# Patient Record
Sex: Female | Born: 2000
Health system: Southern US, Community
[De-identification: ages and names within clinical notes are randomized; demographics above are authoritative.]

---
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
## 2020-07-25 ENCOUNTER — Other Ambulatory Visit: Payer: Self-pay

## 2020-07-25 ENCOUNTER — Ambulatory Visit
Admission: EM | Admit: 2020-07-25 | Discharge: 2020-07-25 | Disposition: A | Discharge status: Home / Self Care | Payer: Managed Care, Other (non HMO) | Attending: Family Medicine | Admitting: Family Medicine

## 2020-07-25 ENCOUNTER — Encounter: Payer: Self-pay | Admitting: Emergency Medicine

## 2020-07-25 DIAGNOSIS — K529 Noninfective gastroenteritis and colitis, unspecified: Secondary | ICD-10-CM | POA: Diagnosis not present

## 2020-07-25 MED ORDER — ONDANSETRON 4 MG PO TBDP: 4.0000 mg | ORAL_TABLET | Freq: Three times a day (TID) | ORAL | 0 refills | Status: DC | PRN

## 2020-07-25 MED ORDER — DICYCLOMINE HCL 20 MG PO TABS: 20.0000 mg | ORAL_TABLET | Freq: Four times a day (QID) | ORAL | 0 refills | Status: DC | PRN

## 2020-07-25 NOTE — ED Triage Notes (Signed)
Patient c/o vomiting, stomach pain and back pain that started on Tuesday.  Patient states that her family has had similar symptoms this week.  Patient also reports diarrhea.

## 2020-07-25 NOTE — Discharge Instructions (Signed)
Lots of fluids.  Medication as prescribed.  Take care  Dr. Mireya Meditz  

## 2020-07-25 NOTE — ED Provider Notes (Signed)
[facility]    CSN: [id number] Arrival date & time: 07/25/20  1434      History   Chief Complaint Chief Complaint  Patient presents with  . Abdominal Pain  . Emesis  . Back Pain   HPI  20 year old female presents with the above complaints.  Started on Tuesday. Family has been sick with GI illness. Patient states that she got it from her mother. She reports nausea, vomiting, diarrhea. Associated upper abdominal pain. Last episode of emesis was this morning. She is tolerating p.o. intake. She ate some bread recently. Pain 5/10 in severity. She has taken some over-the-counter medication without relief. No fever.   Home Medications    Prior to Admission medications   Medication Sig Start Date End Date Taking? Authorizing Provider  dicyclomine (BENTYL) 20 MG tablet Take 1 tablet (20 mg total) by mouth 4 (four) times daily as needed for spasms. 07/25/20  Yes Sylar Voong G, DO  ondansetron (ZOFRAN ODT) 4 MG disintegrating tablet Take 1 tablet (4 mg total) by mouth every 8 (eight) hours as needed for nausea or vomiting. 07/25/20  Yes Tommie Sams, DO   Social History Social History   Tobacco Use  . Smoking status: Never Smoker  . Smokeless tobacco: Never Used  Vaping Use  . Vaping Use: Never used  Substance Use Topics  . Alcohol use: No  . Drug use: No     Allergies   Patient has no known allergies.   Review of Systems Review of Systems  Constitutional: Positive for appetite change. Negative for fever.  Gastrointestinal: Positive for abdominal pain, diarrhea, nausea and vomiting.   Physical Exam Triage Vital Signs ED Triage Vitals  Enc Vitals Group     BP 07/25/20 1453 129/72     Pulse Rate 07/25/20 1453 83     Resp 07/25/20 1453 14     Temp 07/25/20 1453 98.4 F (36.9 C)     Temp Source 07/25/20 1453 Oral     SpO2 07/25/20 1453 100 %     Weight 07/25/20 1450 170 lb (77.1 kg)     Height 07/25/20 1450 5\' 7"  (1.702 m)     Head Circumference --       Peak Flow --      Pain Score 07/25/20 1450 5     Pain Loc --      Pain Edu? --      Excl. in GC? --    Updated Vital Signs BP 129/72 (BP Location: Left Arm)   Pulse 83   Temp 98.4 F (36.9 C) (Oral)   Resp 14   Ht 5\' 7"  (1.702 m)   Wt 77.1 kg   LMP 07/04/2020 (Approximate)   SpO2 100%   BMI 26.63 kg/m   Visual Acuity Right Eye Distance:   Left Eye Distance:   Bilateral Distance:    Right Eye Near:   Left Eye Near:    Bilateral Near:     Physical Exam Vitals and nursing note reviewed.  Constitutional:      General: She is not in acute distress.    Appearance: Normal appearance. She is well-developed. She is not ill-appearing.  HENT:     Head: Normocephalic and atraumatic.  Cardiovascular:     Rate and Rhythm: Normal rate and regular rhythm.  Pulmonary:     Effort: Pulmonary effort is normal.     Breath sounds: Normal breath sounds. No wheezing, rhonchi or rales.  Abdominal:  General: There is no distension.     Palpations: Abdomen is soft.     Comments: Tenderness to palpation in the epigastric region.  Neurological:     Mental Status: She is alert.    UC Treatments / Results  Labs (all labs ordered are listed, but only abnormal results are displayed) Labs Reviewed - No data to display  EKG   Radiology No results found.  Procedures Procedures (including critical care time)  Medications Ordered in UC Medications - No data to display  Initial Impression / Assessment and Plan / UC Course  I have reviewed the triage vital signs and the nursing notes.  Pertinent labs & imaging results that were available during my care of the patient were reviewed by me and considered in my medical decision making (see chart for details).    20 year old female presents with gastroenteritis. Treating with Zofran and dicyclomine. Supportive care. Work note given.  Final Clinical Impressions(s) / UC Diagnoses   Final diagnoses:  Gastroenteritis     Discharge  Instructions     Lots of fluids.  Medication as prescribed.  Take care  Dr. Adriana Simas    ED Prescriptions    Medication Sig Dispense Auth. Provider   ondansetron (ZOFRAN ODT) 4 MG disintegrating tablet Take 1 tablet (4 mg total) by mouth every 8 (eight) hours as needed for nausea or vomiting. 20 tablet Aldona Bryner G, DO   dicyclomine (BENTYL) 20 MG tablet Take 1 tablet (20 mg total) by mouth 4 (four) times daily as needed for spasms. 20 tablet Tommie Sams, DO     PDMP not reviewed this encounter.   Tommie Sams, [place] 07/25/20 1553

## 2021-03-05 ENCOUNTER — Encounter (HOSPITAL_BASED_OUTPATIENT_CLINIC_OR_DEPARTMENT_OTHER): Payer: Self-pay | Admitting: Family Medicine

## 2021-03-05 ENCOUNTER — Encounter (HOSPITAL_BASED_OUTPATIENT_CLINIC_OR_DEPARTMENT_OTHER): Payer: Self-pay | Admitting: Pediatrics

## 2021-03-05 ENCOUNTER — Other Ambulatory Visit: Payer: Self-pay

## 2021-03-05 ENCOUNTER — Ambulatory Visit (INDEPENDENT_AMBULATORY_CARE_PROVIDER_SITE_OTHER): Payer: Managed Care, Other (non HMO) | Admitting: Family Medicine

## 2021-03-05 DIAGNOSIS — R21 Rash and other nonspecific skin eruption: Secondary | ICD-10-CM | POA: Diagnosis not present

## 2021-03-05 DIAGNOSIS — K625 Hemorrhage of anus and rectum: Secondary | ICD-10-CM

## 2021-03-05 NOTE — Progress Notes (Signed)
New Patient Office Visit  Subjective:  Patient ID: Candace Owens, female    DOB: 2001-03-08  Age: 20 y.o. MRN: [id number]  CC:  Chief Complaint  Patient presents with   Establish Care    Patient states she has a rash on the back of her thighs and ribs with no pain or itching.  She reports sometimes when she wipes she see rectal blood.  No pain, itching or burning.    HPI Candace Owens is a 20 year old female presenting to establish in clinic.  She has current concerns as outlined above.  Reports past medical history of depressive mood.  Rash: First noticed about 3 months ago, had areas of flat, dark pinkish lesions on her skin, notably over posterior thigh, trunk.  These lasted for about 2 months and then resolved.  Denies any current lesions.  Never had any associated itch or pain.  No discharge or bleeding.  Denies any prior similar skin changes.  Rectal bleeding: First started about 2 years ago.  Primarily would notice blood when wiping, small amount.  Generally this will last for only about 1 day.  Since it began, reports that it will recur about every 2 to 3 months.  Most recent episode was in August.  Denies any associated pain or itching.  No associated nausea, vomiting, diarrhea or constipation.  Denies any family history of GI issues.  Feels that her weight has fluctuated some over this time, no specific unintentional weight loss recalled.  Patient currently works 2 jobs, one is with [employer] the other was with [employer].  Outside of work, she enjoys hiking, skateboarding, camping, being outdoors. Patient is originally from [place] but she has been primarily living in [place] for about the past 10 to 11 years.  History reviewed. No pertinent past medical history.  History reviewed. No pertinent surgical history.  Family History  Problem Relation Age of Onset   Depression Paternal Grandfather     Social History   Socioeconomic History   Marital  status: Single    Spouse name: Not on file   Number of children: Not on file   Years of education: Not on file   Highest education level: Not on file  Occupational History   Not on file  Tobacco Use   Smoking status: Never   Smokeless tobacco: Never  Vaping Use   Vaping Use: Never used  Substance and Sexual Activity   Alcohol use: No   Drug use: No   Sexual activity: Not Currently  Other Topics Concern   Not on file  Social History Narrative   Not on file   Social Determinants of Health   Financial Resource Strain: Not on file  Food Insecurity: Not on file  Transportation Needs: Not on file  Physical Activity: Not on file  Stress: Not on file  Social Connections: Not on file  Intimate Partner Violence: Not on file    Objective:   Today's Vitals: BP 110/80   Pulse 72   Ht 5\' 7"  (1.702 m)   Wt 194 lb 9.6 oz (88.3 kg)   SpO2 100%   BMI 30.48 kg/m   Physical Exam  20 year old female in no acute distress Cardiovascular exam with regular rate and rhythm, no murmurs appreciated Lungs clear to auscultation bilaterally  Assessment & Plan:   Problem List Items Addressed This Visit       Digestive   Rectal bleeding    No active bleeding today or other symptoms such  as abdominal pain, diarrhea or constipation Discussed most likely etiology is related to intermittent, temporary rectal bleeding and general considerations in order to ensure regular bowel movements, avoidance of constipation, avoidance of straining Recommend checking CBC and CMP, patient declines at this time Recommend monitoring symptoms, if symptoms do recur, recommend returning to the office for further evaluation        Musculoskeletal and Integument   Rash    Uncertain etiology given lack of rash present during office visit today Recommend continue to monitor for recurrence, if rash does recur, recommend presenting to the office to allow for further evaluation Could consider initial laboratory  evaluation including CBC and CMP, patient declines today Plan to complete labs with CPE in about 4 to 6 months       Outpatient Encounter Medications as of 03/05/2021  Medication Sig   [DISCONTINUED] dicyclomine (BENTYL) 20 MG tablet Take 1 tablet (20 mg total) by mouth 4 (four) times daily as needed for spasms.   [DISCONTINUED] ondansetron (ZOFRAN ODT) 4 MG disintegrating tablet Take 1 tablet (4 mg total) by mouth every 8 (eight) hours as needed for nausea or vomiting.   No facility-administered encounter medications on file as of 03/05/2021.    Follow-up: Return in about 6 months (around 09/03/2021) for CPE.  Plan to complete labs at next visit including CBC, CMP, TSH with reflex, A1c, lipid panel.  Christophere Hillhouse J [place], MD

## 2021-03-06 DIAGNOSIS — R21 Rash and other nonspecific skin eruption: Secondary | ICD-10-CM | POA: Insufficient documentation

## 2021-03-06 DIAGNOSIS — K625 Hemorrhage of anus and rectum: Secondary | ICD-10-CM | POA: Insufficient documentation

## 2021-03-06 NOTE — Assessment & Plan Note (Signed)
Uncertain etiology given lack of rash present during office visit today Recommend continue to monitor for recurrence, if rash does recur, recommend presenting to the office to allow for further evaluation Could consider initial laboratory evaluation including CBC and CMP, patient declines today Plan to complete labs with CPE in about 4 to 6 months

## 2021-03-06 NOTE — Assessment & Plan Note (Signed)
No active bleeding today or other symptoms such as abdominal pain, diarrhea or constipation Discussed most likely etiology is related to intermittent, temporary rectal bleeding and general considerations in order to ensure regular bowel movements, avoidance of constipation, avoidance of straining Recommend checking CBC and CMP, patient declines at this time Recommend monitoring symptoms, if symptoms do recur, recommend returning to the office for further evaluation

## 2021-09-03 ENCOUNTER — Encounter (HOSPITAL_BASED_OUTPATIENT_CLINIC_OR_DEPARTMENT_OTHER): Payer: Managed Care, Other (non HMO) | Admitting: Family Medicine

## 2022-07-14 ENCOUNTER — Encounter: Payer: Self-pay | Admitting: Family Medicine

## 2022-07-14 ENCOUNTER — Ambulatory Visit: Payer: BC Managed Care – PPO | Admitting: Family

## 2022-07-26 ENCOUNTER — Ambulatory Visit: Payer: BC Managed Care – PPO | Admitting: Family

## 2022-08-04 ENCOUNTER — Ambulatory Visit: Payer: BC Managed Care – PPO | Admitting: Family

## 2022-09-29 ENCOUNTER — Ambulatory Visit: Payer: BC Managed Care – PPO | Admitting: Family Medicine

## 2022-09-29 NOTE — Progress Notes (Deleted)
    New patient visit   Patient: Candace Owens   DOB: [birth year]   21 y.o. Female  MRN: [id number] Visit Date: 09/29/2022  Today's healthcare provider: Ronnald Ramp, MD   No chief complaint on file.  Subjective    Candace Owens is a 22 y.o. female who presents today as a new patient to establish care.  HPI   Encounter to Establish Care Patient presents to establish care  Introduced myself and my role as primary care physician  We reviewed patient's medical, surgical, and social history and medications as listed below    PMHX   Last annual physical: ***   *** Medications: ***   ***  Medications: ***    Social Hx  Tobacco use: *** Alcohol Use : *** Illicit drug use: ***  ***    Concerns for Today:   ***:   No past medical history on file. No past surgical history on file. Family Status  Relation Name Status   Mother  Alive   Father  Alive   PGF  (Not Specified)   Family History  Problem Relation Age of Onset   Depression Paternal Grandfather    Social History   Socioeconomic History   Marital status: Single    Spouse name: Not on file   Number of children: Not on file   Years of education: Not on file   Highest education level: Not on file  Occupational History   Not on file  Tobacco Use   Smoking status: Never   Smokeless tobacco: Never  Vaping Use   Vaping Use: Never used  Substance and Sexual Activity   Alcohol use: No   Drug use: No   Sexual activity: Not Currently  Other Topics Concern   Not on file  Social History Narrative   Not on file   Social Determinants of Health   Financial Resource Strain: Not on file  Food Insecurity: Not on file  Transportation Needs: Not on file  Physical Activity: Not on file  Stress: Not on file  Social Connections: Not on file   No outpatient medications prior to visit.   No facility-administered medications prior to visit.   No Known Allergies   There is no  immunization history on file for this patient.  Health Maintenance  Topic Date Due   COVID-19 Vaccine (1) Never done   HPV VACCINES (1 - 2-dose series) Never done   HIV Screening  Never done   Hepatitis C Screening  Never done   DTaP/Tdap/Td (1 - Tdap) Never done   PAP-Cervical Cytology Screening  Never done   PAP SMEAR-Modifier  Never done   INFLUENZA VACCINE  12/30/2022    Patient Care Team: [place], Buren Kos, MD as PCP - General (Family Medicine)  Review of Systems  {Labs  Heme  Chem  Endocrine  Serology  Results Review (optional):23779}   Objective    There were no vitals taken for this visit. {Show previous vital signs (optional):23777}  Physical Exam ***  Depression Screen    03/05/2021    8:43 AM  PHQ 2/9 Scores  PHQ - 2 Score 4  PHQ- 9 Score 13   No results found for any visits on 09/29/22.  Assessment & Plan     ***  No follow-ups on file.     {provider attestation***:1}   Ronnald Ramp, MD  [facility] [phone] (phone) [phone] (fax)  [facility]

## 2023-04-22 ENCOUNTER — Other Ambulatory Visit: Payer: Self-pay

## 2023-04-22 ENCOUNTER — Encounter: Payer: Self-pay | Admitting: *Deleted

## 2023-04-22 ENCOUNTER — Ambulatory Visit
Admission: EM | Admit: 2023-04-22 | Discharge: 2023-04-22 | Disposition: A | Discharge status: Home / Self Care | Payer: BC Managed Care – PPO | Attending: Family Medicine | Admitting: Family Medicine

## 2023-04-22 DIAGNOSIS — M546 Pain in thoracic spine: Secondary | ICD-10-CM

## 2023-04-22 MED ORDER — IBUPROFEN 600 MG PO TABS: 600.0000 mg | ORAL_TABLET | Freq: Four times a day (QID) | ORAL | 0 refills | Status: AC | PRN

## 2023-04-22 MED ORDER — CYCLOBENZAPRINE HCL 10 MG PO TABS: 10.0000 mg | ORAL_TABLET | Freq: Two times a day (BID) | ORAL | 0 refills | Status: AC | PRN
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
